# Patient Record
Sex: Male | Born: 1985 | Race: Black or African American | Hispanic: No | Marital: Married | Smoking: Never smoker
Health system: Southern US, Community
[De-identification: ages and names within clinical notes are randomized; demographics above are authoritative.]

## PROBLEM LIST (undated history)

## (undated) ENCOUNTER — Emergency Department (HOSPITAL_COMMUNITY): Discharge status: Absent without leave | Payer: Self-pay

## (undated) DIAGNOSIS — K429 Umbilical hernia without obstruction or gangrene: Secondary | ICD-10-CM

---
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
## 2003-02-17 ENCOUNTER — Emergency Department (HOSPITAL_COMMUNITY): Admission: EM | Admit: 2003-02-17 | Discharge: 2003-02-17 | Payer: Self-pay | Admitting: Emergency Medicine

## 2003-05-24 ENCOUNTER — Emergency Department (HOSPITAL_COMMUNITY): Admission: EM | Admit: 2003-05-24 | Discharge: 2003-05-24 | Payer: Self-pay | Admitting: Emergency Medicine

## 2003-06-13 ENCOUNTER — Emergency Department (HOSPITAL_COMMUNITY): Admission: AD | Admit: 2003-06-13 | Discharge: 2003-06-13 | Payer: Self-pay | Admitting: Family Medicine

## 2003-06-15 ENCOUNTER — Emergency Department (HOSPITAL_COMMUNITY): Admission: EM | Admit: 2003-06-15 | Discharge: 2003-06-15 | Payer: Self-pay | Admitting: Emergency Medicine

## 2003-07-13 ENCOUNTER — Ambulatory Visit (HOSPITAL_BASED_OUTPATIENT_CLINIC_OR_DEPARTMENT_OTHER): Admission: RE | Admit: 2003-07-13 | Discharge: 2003-07-13 | Payer: Self-pay | Admitting: Orthopedic Surgery

## 2003-07-13 ENCOUNTER — Ambulatory Visit (HOSPITAL_COMMUNITY): Admission: RE | Admit: 2003-07-13 | Discharge: 2003-07-13 | Payer: Self-pay | Admitting: Orthopedic Surgery

## 2003-08-15 ENCOUNTER — Emergency Department (HOSPITAL_COMMUNITY): Admission: EM | Admit: 2003-08-15 | Discharge: 2003-08-15 | Payer: Self-pay | Admitting: Family Medicine

## 2003-08-23 ENCOUNTER — Emergency Department (HOSPITAL_COMMUNITY): Admission: EM | Admit: 2003-08-23 | Discharge: 2003-08-23 | Payer: Self-pay | Admitting: Family Medicine

## 2003-08-24 ENCOUNTER — Emergency Department (HOSPITAL_COMMUNITY): Admission: EM | Admit: 2003-08-24 | Discharge: 2003-08-24 | Payer: Self-pay | Admitting: Emergency Medicine

## 2003-11-09 ENCOUNTER — Emergency Department (HOSPITAL_COMMUNITY): Admission: EM | Admit: 2003-11-09 | Discharge: 2003-11-09 | Payer: Self-pay

## 2003-11-17 ENCOUNTER — Emergency Department (HOSPITAL_COMMUNITY): Admission: EM | Admit: 2003-11-17 | Discharge: 2003-11-17 | Payer: Self-pay | Admitting: *Deleted

## 2003-12-29 ENCOUNTER — Emergency Department (HOSPITAL_COMMUNITY): Admission: EM | Admit: 2003-12-29 | Discharge: 2003-12-29 | Payer: Self-pay | Admitting: Family Medicine

## 2004-04-22 ENCOUNTER — Emergency Department (HOSPITAL_COMMUNITY): Admission: EM | Admit: 2004-04-22 | Discharge: 2004-04-22 | Payer: Self-pay | Admitting: Emergency Medicine

## 2005-03-20 ENCOUNTER — Emergency Department (HOSPITAL_COMMUNITY): Admission: EM | Admit: 2005-03-20 | Discharge: 2005-03-20 | Payer: Self-pay | Admitting: Family Medicine

## 2005-10-19 ENCOUNTER — Emergency Department (HOSPITAL_COMMUNITY): Admission: EM | Admit: 2005-10-19 | Discharge: 2005-10-19 | Payer: Self-pay | Admitting: Emergency Medicine

## 2005-10-24 IMAGING — CT CT ABDOMEN W/ CM
1 of 3 series · 15 of 32 positions shown, 20 images · IV contrast (omnipaque)
Comparison: none

CLINICAL DATA: 18-year-old with right lower quadrant pain.  
 CT ABDOMEN AND PELVIS WITH CONTRAST
TECHNIQUE: Helical images are performed through the abdomen and pelvis following administration of oral contrast and during administration of 150 cc of Omnipaque 300.  
 CT ABDOMEN 
 Images of the lung bases are unremarkable.  No focal abnormality is seen within the liver, spleen, pancreas, adrenal glands, or kidneys.  The gallbladder is present. 
 IMPRESSION
 No evidence for acute abnormality of the abdomen 
 CT PELVIS 
 The appendix is well-visualized and normal in its appearance.  Pelvic bowel loops are unremarkable.  No free pelvic fluid or pelvic adenopathy identified.  
 No evidence for acute pelvic abnormality.

[Series 2: abd/pelvis 5.0 b30f · axial · 0.60mm/px · z∈[+1248,+1682]mm · 15 of 97 slices shown, 20 images]
[im 5/97  soft-tissue]
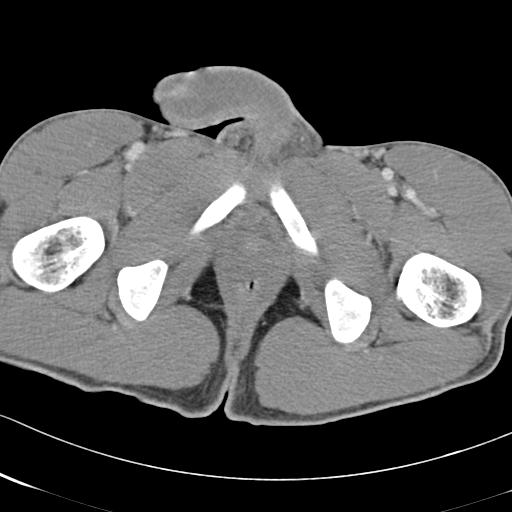
[im 5/97  bone]
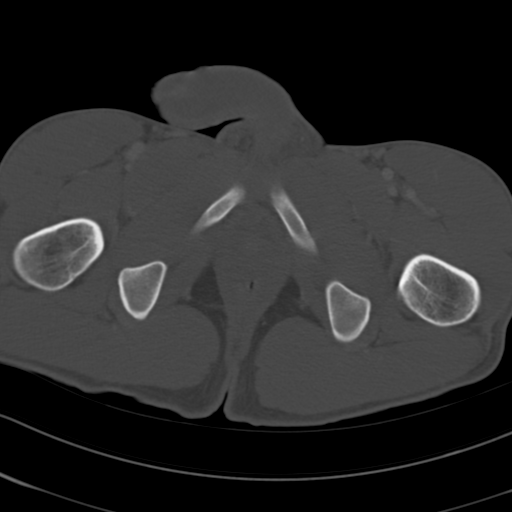
[im 14/97  soft-tissue]
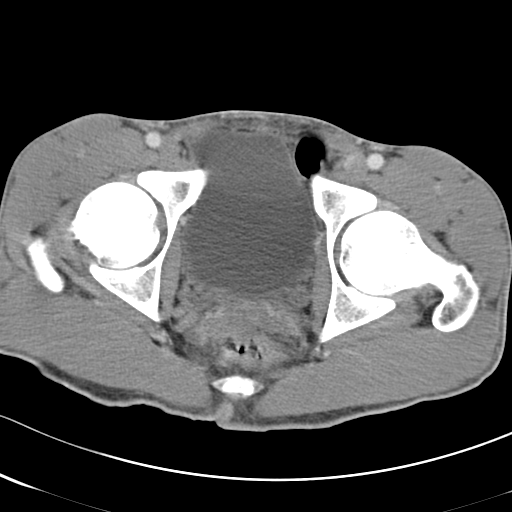
[im 19/97  soft-tissue]
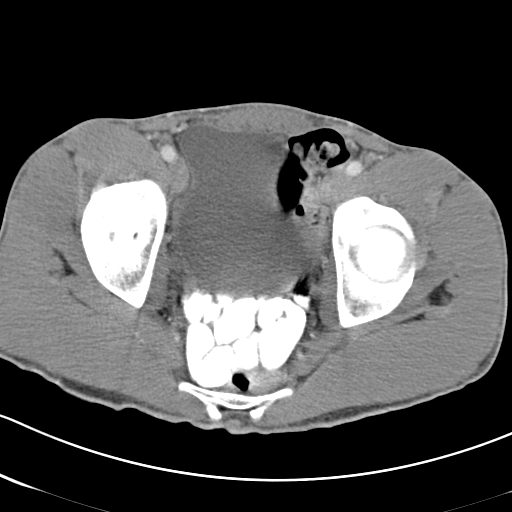
[im 28/97  soft-tissue]
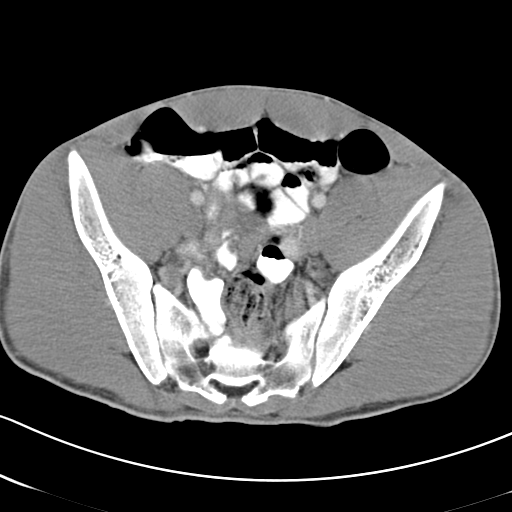
[im 33/97  soft-tissue]
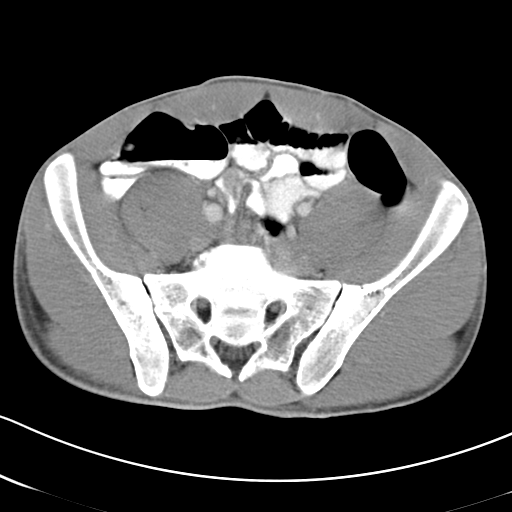
[im 37/97  soft-tissue]
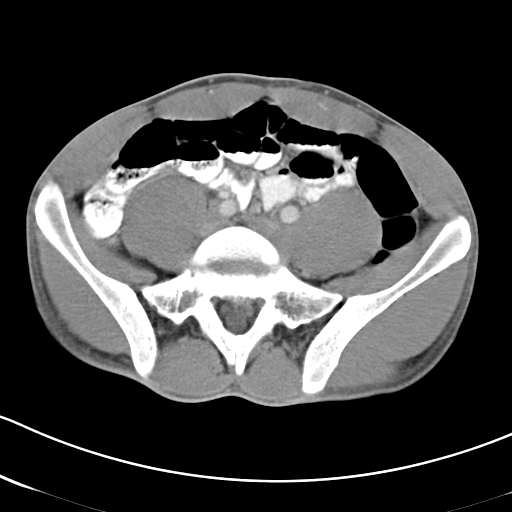
[im 46/97  soft-tissue]
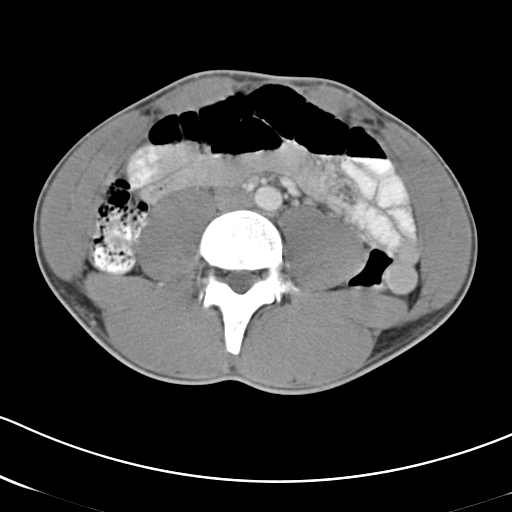
[im 51/97  soft-tissue]
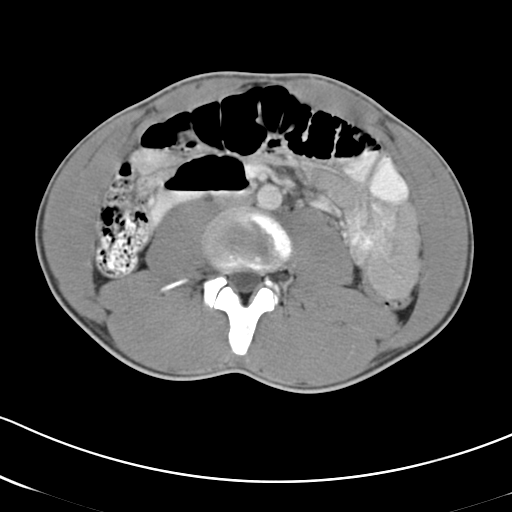
[im 60/97  soft-tissue]
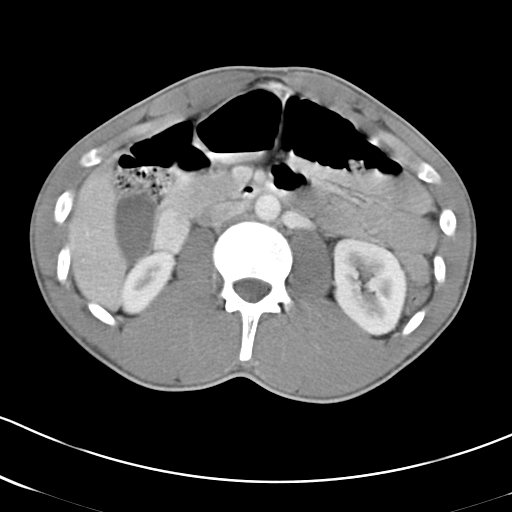
[im 60/97  bone]
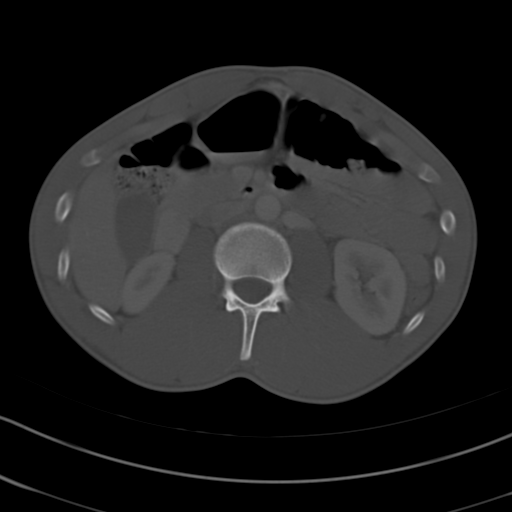
[im 65/97  soft-tissue]
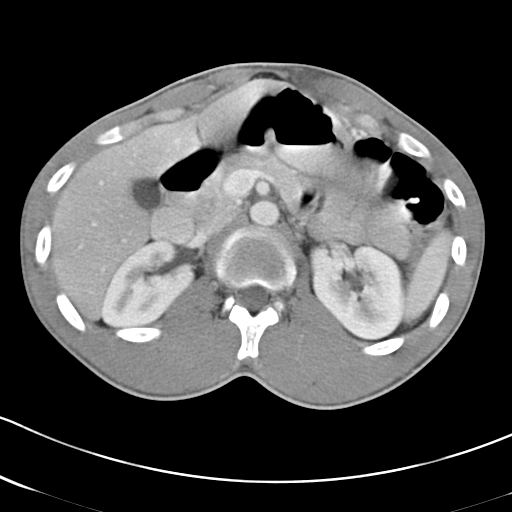
[im 74/97  soft-tissue]
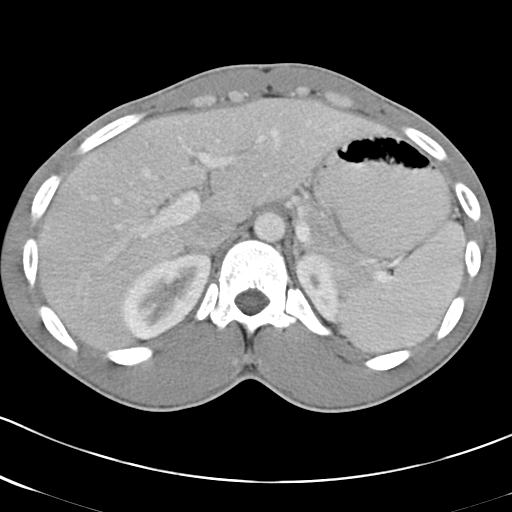
[im 78/97  soft-tissue]
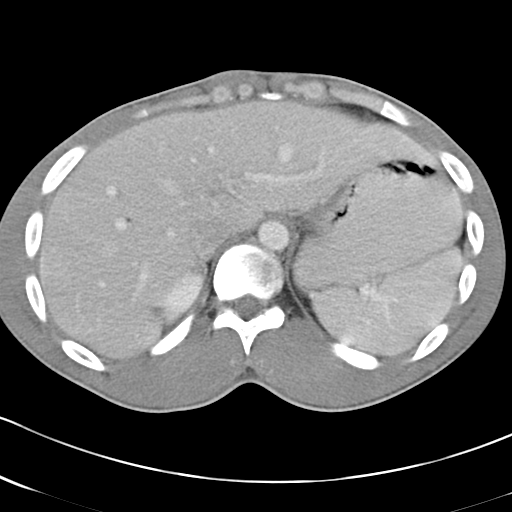
[im 78/97  lung]
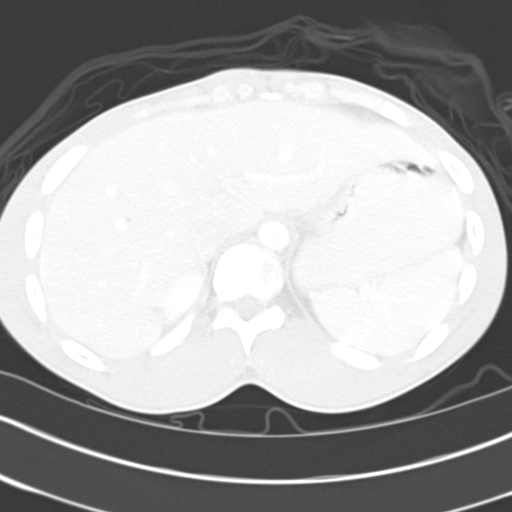
[im 83/97  soft-tissue]
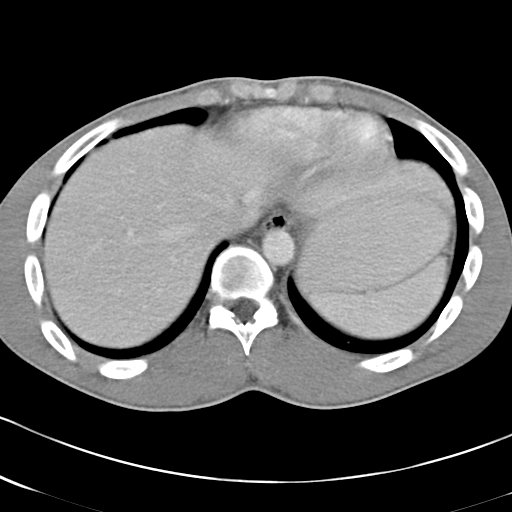
[im 83/97  lung]
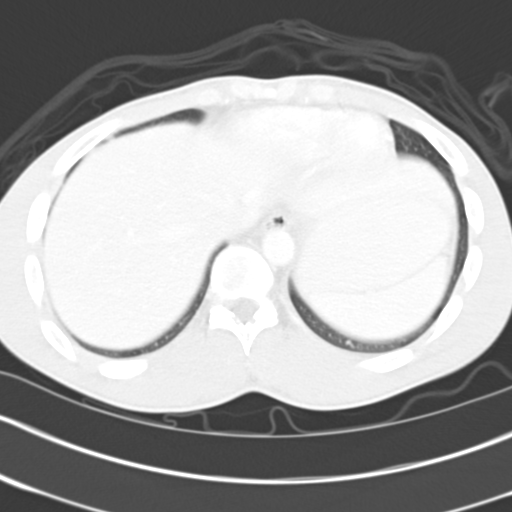
[im 87/97  lung]
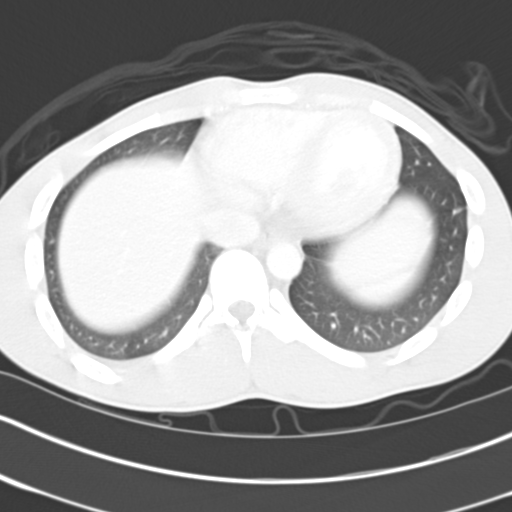
[im 92/97  soft-tissue]
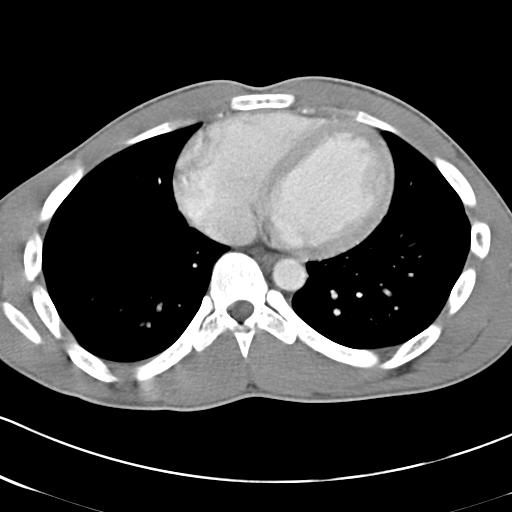
[im 92/97  lung]
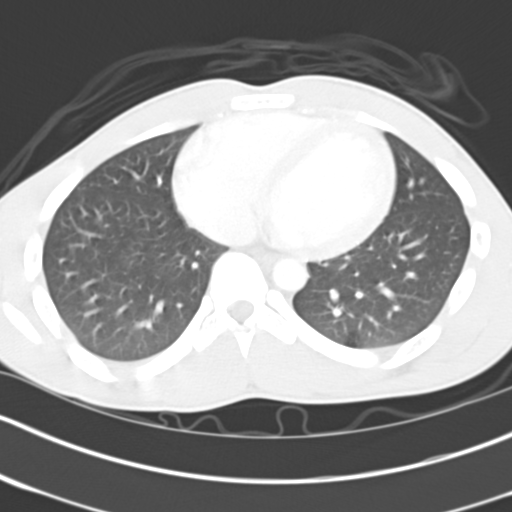

[15 of 32 positions shown; findings below may reference images not displayed]

## 2007-10-04 IMAGING — CR DG HAND COMPLETE 3+V*R*
3 series · 3 of 3 positions shown · non-contrast
Comparison: none

CLINICAL DATA: 20 year old male; Right thumb pain, trauma.
 RIGHT HAND ? 3 VIEWS ? 10/19/05:

[view not recorded (1 of 3)]
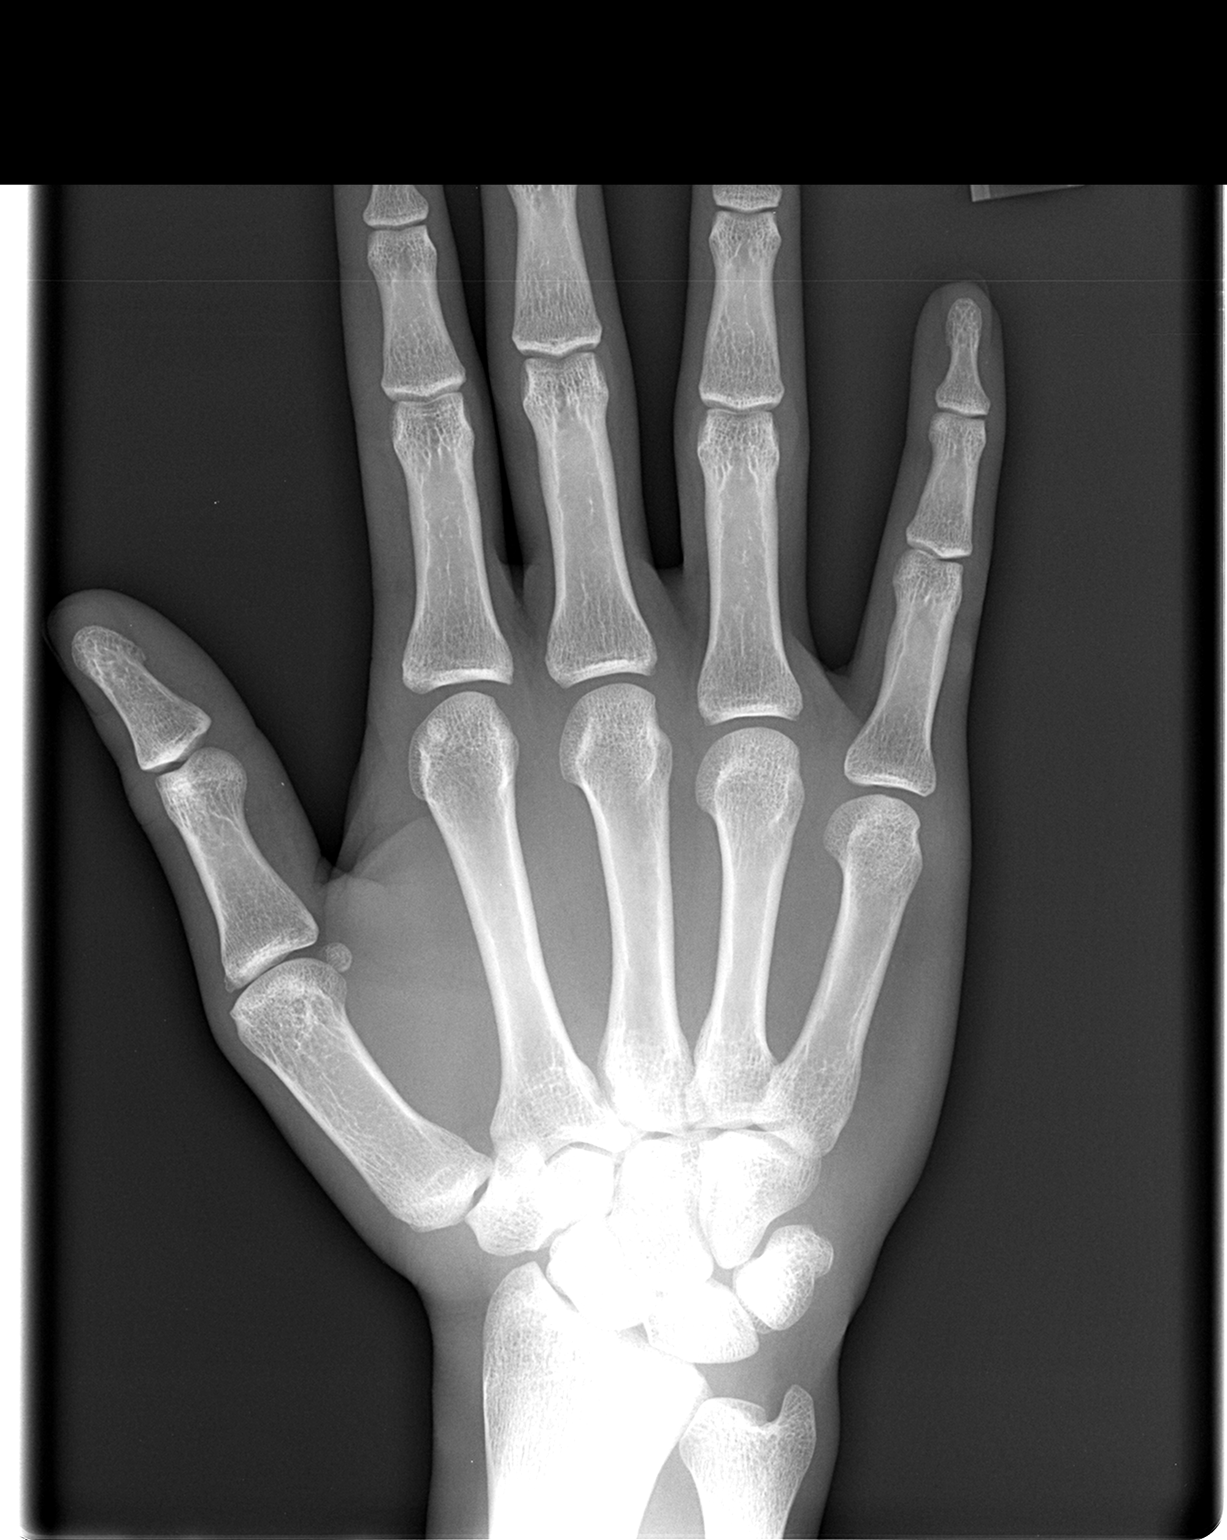

[view not recorded (2 of 3)]
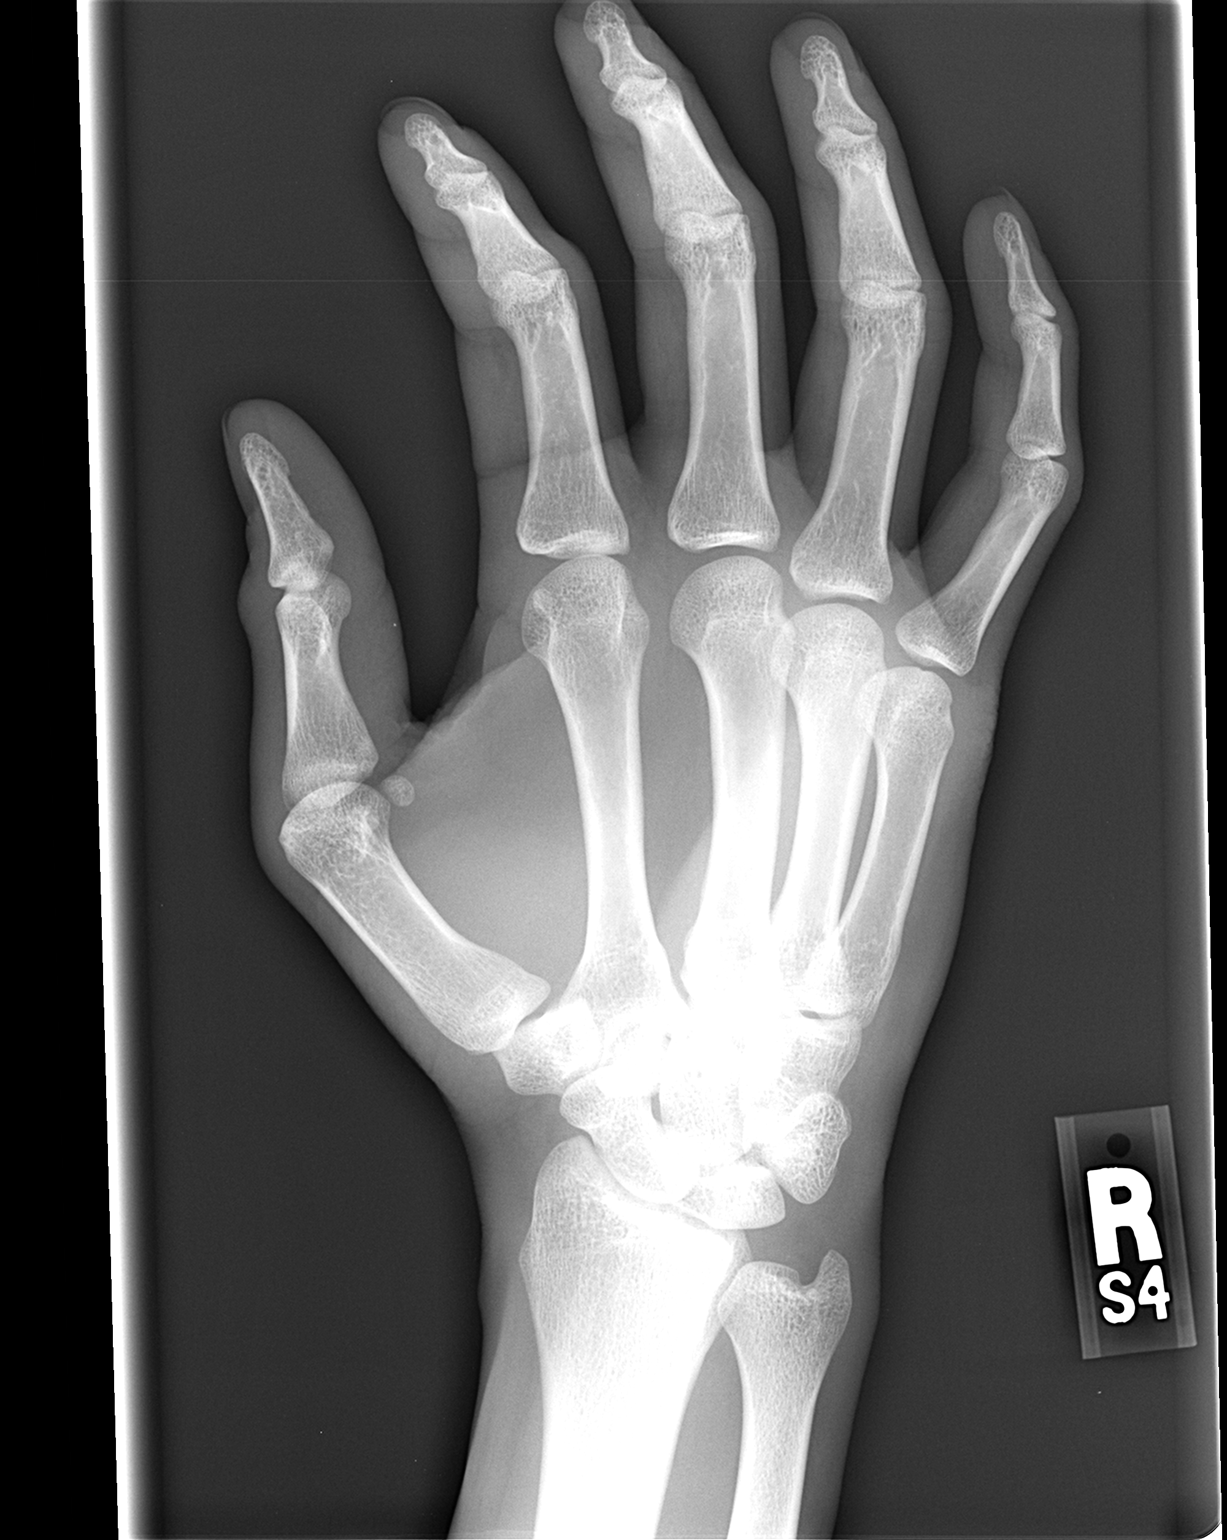

[view not recorded (3 of 3)]
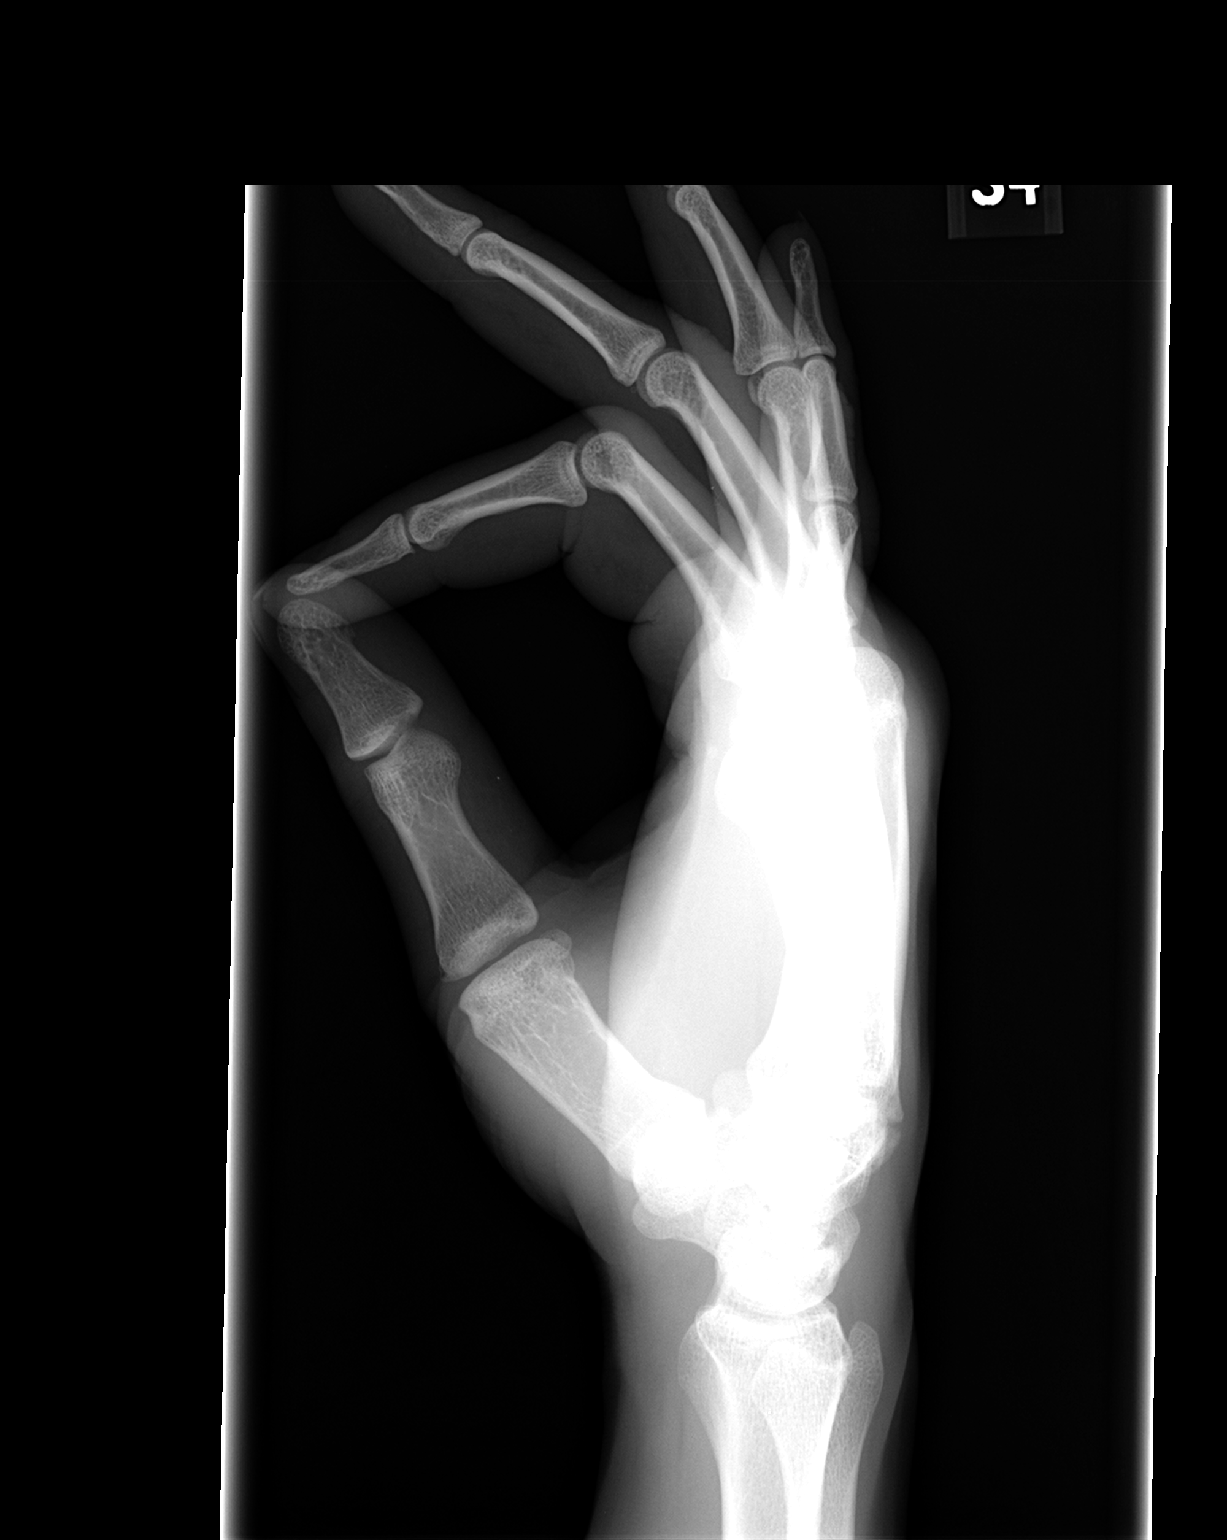

[3 of 3 positions shown; findings below may reference images not displayed]

FINDINGS: No acute fracture, malalignment, or soft tissue abnormality.
IMPRESSION: No acute finding.

## 2009-07-11 ENCOUNTER — Emergency Department (HOSPITAL_COMMUNITY): Admission: EM | Admit: 2009-07-11 | Discharge: 2009-07-11 | Payer: Self-pay | Admitting: Family Medicine

## 2010-06-12 LAB — GC/CHLAMYDIA PROBE AMP, GENITAL
Chlamydia, DNA Probe: NEGATIVE
GC Probe Amp, Genital: NEGATIVE

## 2021-11-28 ENCOUNTER — Ambulatory Visit
Admission: EM | Admit: 2021-11-28 | Discharge: 2021-11-28 | Disposition: A | Discharge status: Home / Self Care | Payer: Medicaid Other | Attending: Physician Assistant | Admitting: Physician Assistant

## 2021-11-28 ENCOUNTER — Ambulatory Visit (INDEPENDENT_AMBULATORY_CARE_PROVIDER_SITE_OTHER): Payer: Medicaid Other

## 2021-11-28 DIAGNOSIS — R0781 Pleurodynia: Secondary | ICD-10-CM | POA: Diagnosis not present

## 2021-11-28 DIAGNOSIS — R1010 Upper abdominal pain, unspecified: Secondary | ICD-10-CM

## 2021-11-28 DIAGNOSIS — S29011A Strain of muscle and tendon of front wall of thorax, initial encounter: Secondary | ICD-10-CM | POA: Diagnosis not present

## 2021-11-28 DIAGNOSIS — R0782 Intercostal pain: Secondary | ICD-10-CM | POA: Diagnosis not present

## 2021-11-28 DIAGNOSIS — R109 Unspecified abdominal pain: Secondary | ICD-10-CM

## 2021-11-28 HISTORY — DX: Umbilical hernia without obstruction or gangrene: K42.9

## 2021-11-28 MED ORDER — KETOROLAC TROMETHAMINE 60 MG/2ML IM SOLN
60.0000 mg | Freq: Once | INTRAMUSCULAR | Status: AC
  Administered 2021-11-28: 60 mg via INTRAMUSCULAR

## 2021-11-28 MED ORDER — NAPROXEN 500 MG PO TABS: 500.0000 mg | ORAL_TABLET | Freq: Two times a day (BID) | ORAL | 0 refills | Status: AC

## 2021-11-28 NOTE — ED Triage Notes (Signed)
Pt had sudden onset of pain in R abdomen when lifting a tired just PTA.  States it felt like something ripped.  Does have known umbilical hernia x years with no f/u. Is tender to palpation.  Needed assistance sitting d/t pain.

## 2021-11-28 NOTE — Discharge Instructions (Addendum)
-  The x-rays are all normal.  There is no evidence of fractured or displaced rib, collapsed lung, bleeding in abdomen or ruptured organ. - You likely have pulled a muscle when you are lifting.  We have given you an injection of ketorolac in the clinic which is an anti-inflammatory medication.  I have sent more anti-inflammatory medication to the pharmacy for you.  You should ice the area and apply topical muscle rubs or consider lidocaine. - Go to emergency department if your pain acutely worsens or you have difficulty breathing, vomiting or any severe acute worsening of her symptoms.

## 2021-11-28 NOTE — ED Provider Notes (Signed)
[facility]    CSN: [id number] Arrival date & time: 11/28/21  1721      History   Chief Complaint Chief Complaint  Patient presents with   Abdominal Pain    HPI Tom Dawson is a 36 y.o. male presenting for right rib/upper abdominal pain for the past 1 hour. He says that he was lifting a tire while changing a spare tire and felt a sudden severe pain that has not gone away. He says pain is worse with talking, breathing, moving, and touching the area.  He denies any pain before he lifted the tire.  He denies chest pain, shortness of breath/wheezing, not nausea/vomiting or diarrhea.  No urinary symptoms.  Patient reports history of umbilical hernia that he has not followed up on but denies any periumbilical pain or swelling.  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  HPI  Past Medical History:  Diagnosis Date   Umbilical hernia     There are no problems to display for this patient.   History reviewed. No pertinent surgical history.     Home Medications    Prior to Admission medications   Medication Sig Start Date End Date Taking? Authorizing Provider  naproxen (NAPROSYN) 500 MG tablet Take 1 tablet (500 mg total) by mouth 2 (two) times daily for 10 days. 11/28/21 12/08/21 Yes Shirlee Latch, PA-C    Family History No family history on file.  Social History Social History   Tobacco Use   Smoking status: Never   Smokeless tobacco: Never  Substance Use Topics   Alcohol use: Never   Drug use: Not Currently    Types: Marijuana     Allergies   Patient has no known allergies.   Review of Systems Review of Systems  Constitutional:  Negative for fatigue and fever.  Respiratory:  Negative for cough, shortness of breath and wheezing.   Cardiovascular:  Negative for chest pain.  Gastrointestinal:  Positive for abdominal pain. Negative for abdominal distention, diarrhea, nausea and vomiting.  Musculoskeletal:  Positive for  arthralgias (right rib pain). Negative for back pain.  Skin:  Negative for wound.  Neurological:  Negative for weakness and headaches.     Physical Exam Triage Vital Signs ED Triage Vitals  Enc Vitals Group     BP      Pulse      Resp      Temp      Temp src      SpO2      Weight      Height      Head Circumference      Peak Flow      Pain Score      Pain Loc      Pain Edu?      Excl. in GC?    No data found.  Updated Vital Signs BP 128/83   Pulse 67   Temp 98.8 F (37.1 C) (Oral)   Resp (!) 24   SpO2 100%   Physical Exam Vitals and nursing note reviewed.  Constitutional:      General: He is not in acute distress.    Appearance: Normal appearance. He is well-developed. He is not ill-appearing.     Comments: Appears uncomfortable, in pain with movement.  HENT:     Head: Normocephalic and atraumatic.  Eyes:     General: No scleral icterus.    Conjunctiva/sclera: Conjunctivae normal.  Cardiovascular:     Rate  and Rhythm: Normal rate and regular rhythm.     Heart sounds: Normal heart sounds.  Pulmonary:     Effort: Pulmonary effort is normal. No respiratory distress.     Breath sounds: Normal breath sounds.  Chest:     Chest wall: Tenderness (TTP diffusely right lateral ribs) present.  Abdominal:     Palpations: Abdomen is soft.     Tenderness: There is abdominal tenderness in the right upper quadrant. There is no right CVA tenderness or left CVA tenderness.  Musculoskeletal:     Cervical back: Neck supple.  Skin:    General: Skin is warm and dry.     Capillary Refill: Capillary refill takes less than 2 seconds.  Neurological:     General: No focal deficit present.     Mental Status: He is alert. Mental status is at baseline.     Motor: No weakness.     Gait: Gait normal.  Psychiatric:        Mood and Affect: Mood normal.        Behavior: Behavior normal.      UC Treatments / Results  Labs (all labs ordered are listed, but only abnormal results  are displayed) Labs Reviewed - No data to display  EKG   Radiology DG Ribs Unilateral W/Chest Right  Result Date: 11/28/2021 CLINICAL DATA:  Right rib and flank pain sudden onset while lifting EXAM: RIGHT RIBS AND CHEST - 3+ VIEW COMPARISON:  None Available. FINDINGS: No fracture or other bone lesions are seen involving the ribs. There is no evidence of pneumothorax or pleural effusion. Both lungs are clear. Heart size and mediastinal contours are within normal limits. Incidental note of right glenohumeral arthrosis. IMPRESSION: 1. No displaced fracture or other radiographic abnormality of the right ribs to explain pain. 2. 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 Electronically Signed   By: Jearld Lesch M.D.   On: 11/28/2021 18:24   DG Abdomen 1 View  Result Date: 11/28/2021 CLINICAL DATA:  Right-sided abdominal pain. EXAM: ABDOMEN - 1 VIEW COMPARISON:  None Available. FINDINGS: The lung bases are clear. Scattered air and stool throughout the colon and down into the rectum. No dilated small bowel loops to suggest obstruction. No free air. The soft tissue shadows are maintained. No worrisome calcifications. The bony structures are intact. IMPRESSION: Unremarkable abdominal radiograph. Electronically Signed   By: Rudie Meyer M.D.   On: 11/28/2021 18:20    Procedures Procedures (including critical care time)  Medications Ordered in UC Medications  ketorolac (TORADOL) injection 60 mg (60 mg Intramuscular Given 11/28/21 1841)    Initial Impression / Assessment and Plan / UC Course  I have reviewed the triage vital signs and the nursing notes.  Pertinent labs & imaging results that were available during my care of the patient were reviewed by me and considered in my medical decision making (see chart for details).   36 year old male presents for right-sided rib and upper abdominal pain following an accident where he was lifting a tire and felt a sudden, sharp  and severe pain that has not gone away.  Pain is 10 out of 10 and worse with movement, talking and deep breathing.  Patient's vitals are stable but he is clearly in pain and uncomfortable especially when changing position.  Chest clear to auscultation heart regular rate and rhythm.  Diffuse tenderness palpation of the right upper quadrant and right lateral ribs.  Right rib x-ray, chest x-ray and KUB obtained.  Reviewed x-rays.  X-ray over read does not indicate any evidence of fractures or dislocations of ribs, pneumothorax or any signs of free air under the diaphragm.  No indication of rupture organ or internal bleeding which I discussed with patient.  Advised patient that his symptoms are most consistent with a muscle strain or pulled muscle.  He asked for pain medication so he was given 60 mg IM ketorolac in clinic for acute pain relief and I have sent naproxen to the pharmacy for him and also encouraged him to try Tylenol, lidocaine and muscle rubs.  Patient is traveling to [place].  He states that his partner will be able to drive.  Advised him to go to emergency department for any acute worsening of his pain or difficulty breathing, vomiting, fever, etc.   Final Clinical Impressions(s) / UC Diagnoses   Final diagnoses:  Rib pain on right side  Pain of upper abdomen  Muscle strain of chest wall, initial encounter     Discharge Instructions      -The x-rays are all normal.  There is no evidence of fractured or displaced rib, collapsed lung, bleeding in abdomen or ruptured organ. - You likely have pulled a muscle when you are lifting.  We have given you an injection of ketorolac in the clinic which is an anti-inflammatory medication.  I have sent more anti-inflammatory medication to the pharmacy for you.  You should ice the area and apply topical muscle rubs or consider lidocaine. - Go to emergency department if your pain acutely worsens or you have difficulty breathing, vomiting or any  severe acute worsening of her symptoms.     ED Prescriptions     Medication Sig Dispense Auth. Provider   naproxen (NAPROSYN) 500 MG tablet Take 1 tablet (500 mg total) by mouth 2 (two) times daily for 10 days. 20 tablet Danton Clap, PA-C      I have reviewed the PDMP during this encounter.   Danton Clap, PA-C 11/28/21 1851
